# Patient Record
Sex: Male | Born: 1965 | Race: Black or African American | Hispanic: No | Marital: Single | Smoking: Never smoker
Health system: Southern US, Community
[De-identification: ages and names within clinical notes are randomized; demographics above are authoritative.]

## PROBLEM LIST (undated history)

## (undated) DIAGNOSIS — M545 Low back pain, unspecified: Secondary | ICD-10-CM

## (undated) DIAGNOSIS — K219 Gastro-esophageal reflux disease without esophagitis: Secondary | ICD-10-CM

## (undated) DIAGNOSIS — M1990 Unspecified osteoarthritis, unspecified site: Secondary | ICD-10-CM

## (undated) DIAGNOSIS — G709 Myoneural disorder, unspecified: Secondary | ICD-10-CM

## (undated) DIAGNOSIS — M4800 Spinal stenosis, site unspecified: Secondary | ICD-10-CM

## (undated) DIAGNOSIS — T7840XA Allergy, unspecified, initial encounter: Secondary | ICD-10-CM

## (undated) HISTORY — DX: Low back pain, unspecified: M54.50

## (undated) HISTORY — PX: COLONOSCOPY: SHX174

## (undated) HISTORY — PX: NO PAST SURGERIES: SHX2092

## (undated) HISTORY — DX: Unspecified osteoarthritis, unspecified site: M19.90

## (undated) HISTORY — DX: Allergy, unspecified, initial encounter: T78.40XA

## (undated) HISTORY — DX: Gastro-esophageal reflux disease without esophagitis: K21.9

## (undated) HISTORY — DX: Myoneural disorder, unspecified: G70.9

---
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
## 1999-05-14 ENCOUNTER — Encounter: Admission: RE | Admit: 1999-05-14 | Discharge: 1999-05-14 | Payer: Self-pay | Admitting: *Deleted

## 1999-05-14 ENCOUNTER — Encounter: Payer: Self-pay | Admitting: Emergency Medicine

## 2000-03-25 ENCOUNTER — Encounter: Payer: Self-pay | Admitting: Emergency Medicine

## 2000-03-25 ENCOUNTER — Emergency Department (HOSPITAL_COMMUNITY): Admission: EM | Admit: 2000-03-25 | Discharge: 2000-03-25 | Payer: Self-pay | Admitting: Emergency Medicine

## 2004-02-05 ENCOUNTER — Ambulatory Visit (HOSPITAL_BASED_OUTPATIENT_CLINIC_OR_DEPARTMENT_OTHER): Admission: RE | Admit: 2004-02-05 | Discharge: 2004-02-05 | Payer: Self-pay | Admitting: Nephrology

## 2004-03-09 ENCOUNTER — Ambulatory Visit (HOSPITAL_COMMUNITY): Admission: RE | Admit: 2004-03-09 | Discharge: 2004-03-09 | Payer: Self-pay | Admitting: Nephrology

## 2004-03-11 ENCOUNTER — Ambulatory Visit (HOSPITAL_COMMUNITY): Admission: RE | Admit: 2004-03-11 | Discharge: 2004-03-11 | Payer: Self-pay | Admitting: Nephrology

## 2004-03-15 ENCOUNTER — Ambulatory Visit (HOSPITAL_COMMUNITY): Admission: RE | Admit: 2004-03-15 | Discharge: 2004-03-15 | Payer: Self-pay | Admitting: Nephrology

## 2004-03-23 ENCOUNTER — Ambulatory Visit (HOSPITAL_COMMUNITY): Admission: RE | Admit: 2004-03-23 | Discharge: 2004-03-23 | Payer: Self-pay | Admitting: Nephrology

## 2004-11-27 IMAGING — RF DG UGI W/ KUB
19 series · 19 of 19 positions shown · non-contrast
Comparison: none

CLINICAL DATA: Right-sided abdominal pain.  Vomiting. 
 DOUBLE CONTRAST UPPER GI SERIES WITH HIGH-DENSITY BARIUM AND KUB
 The scout radiograph shows a normal bowel gas pattern.
 The esophagus is normal in appearance.  There is no evidence of mass or stricture.  There is no evidence of hiatal hernia.  No gastroesophageal reflux was seen during the study.  Esophageal motility is within normal limits. 
 The stomach is normal in appearance.  There is no evidence of masses or ulcers.  The duodenal bulb and sweep are also normal in appearance. 
 IMPRESSION
 Normal upper GI series.

[Series 1: run · 1 of 1 slices shown (1 of 19)]
[im 1/1]
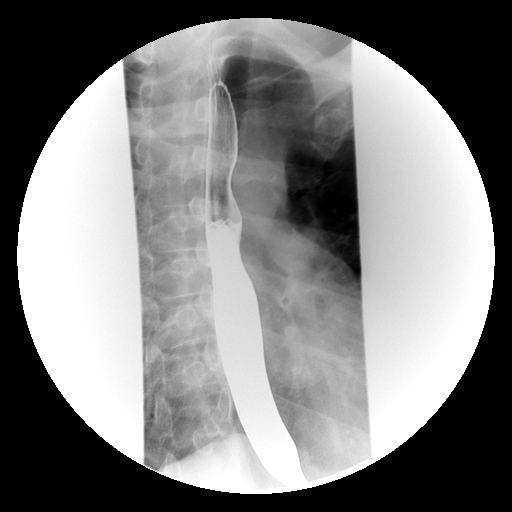

[Series 2: run · 1 of 1 slices shown (2 of 19)]
[im 1/1]
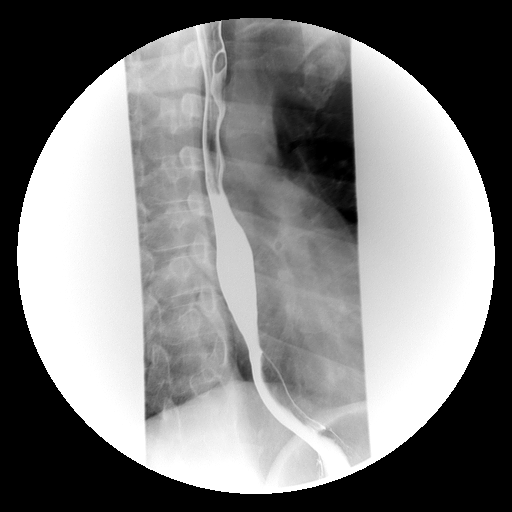

[Series 3: run · 1 of 1 slices shown (3 of 19)]
[im 1/1]
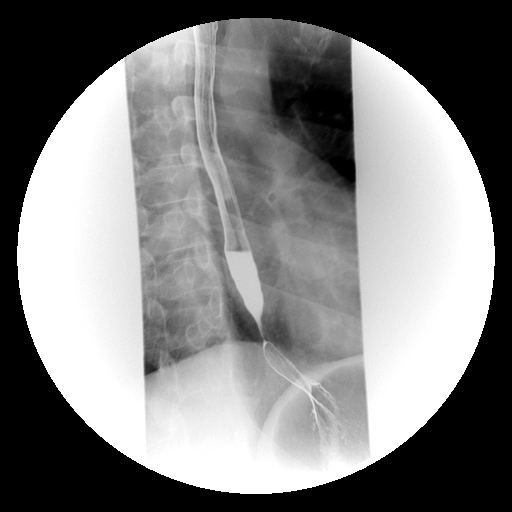

[Series 4: run · 1 of 1 slices shown (4 of 19)]
[im 1/1]
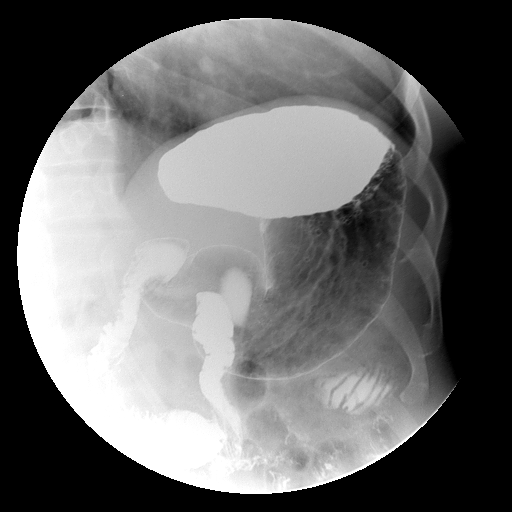

[Series 5: run · 1 of 1 slices shown (5 of 19)]
[im 1/1]
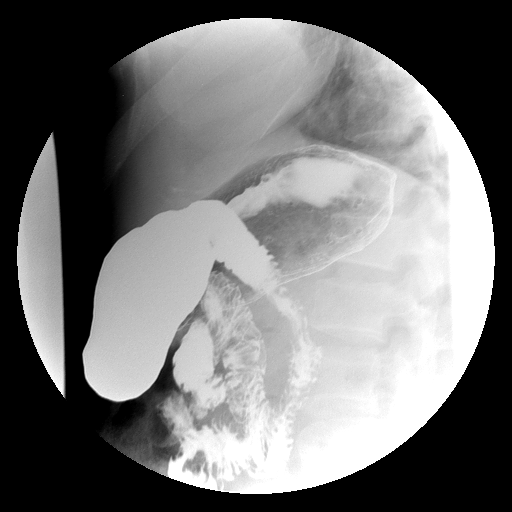

[Series 6: run · 1 of 1 slices shown (6 of 19)]
[im 1/1]
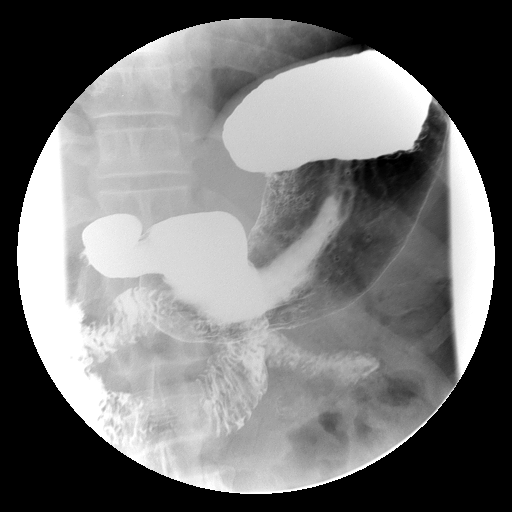

[Series 7: run · 1 of 1 slices shown (7 of 19)]
[im 1/1]
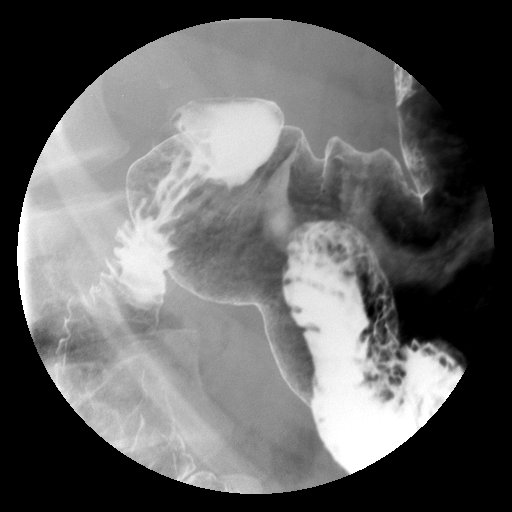

[Series 8: run · 1 of 1 slices shown (8 of 19)]
[im 1/1]
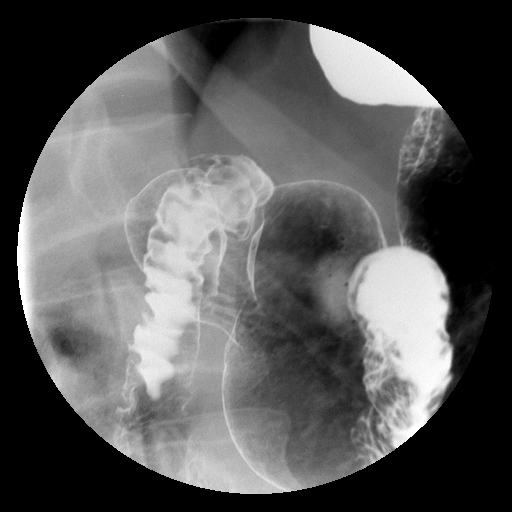

[Series 9: run · 1 of 1 slices shown (9 of 19)]
[im 1/1]
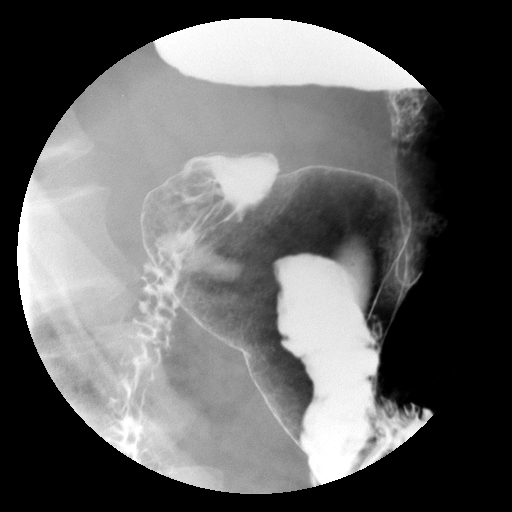

[Series 10: run · 1 of 1 slices shown (10 of 19)]
[im 1/1]
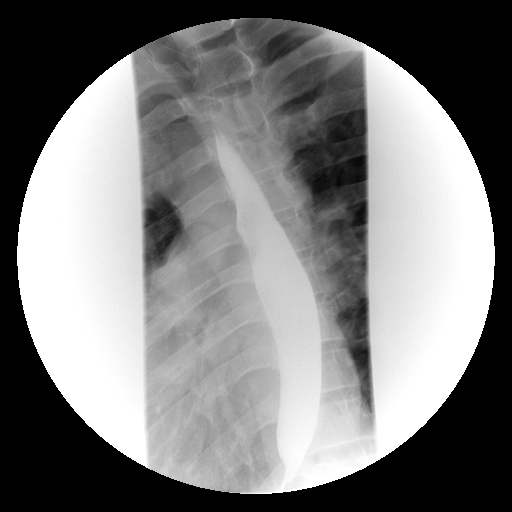

[Series 11: run · 1 of 1 slices shown (11 of 19)]
[im 1/1]
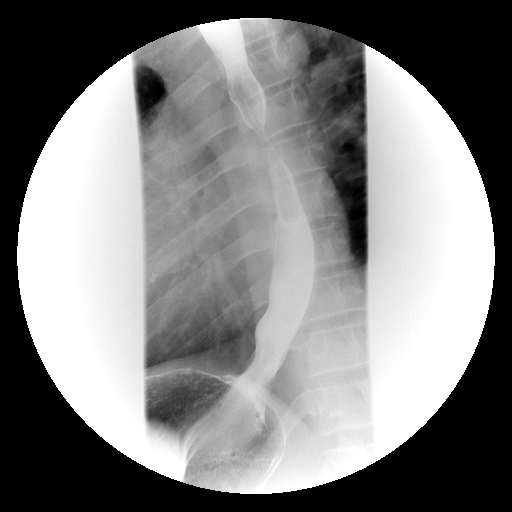

[Series 12: run · 1 of 1 slices shown (12 of 19)]
[im 1/1]
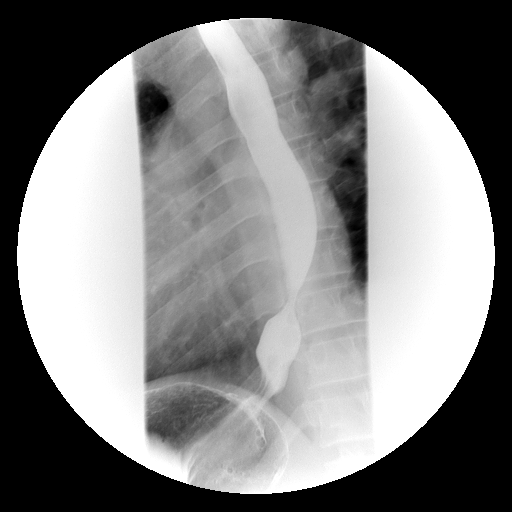

[Series 13: run · 1 of 1 slices shown (13 of 19)]
[im 1/1]
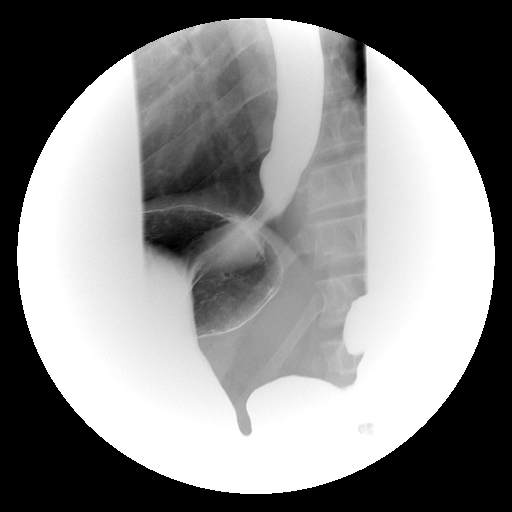

[Series 14: run · 1 of 1 slices shown (14 of 19)]
[im 1/1]
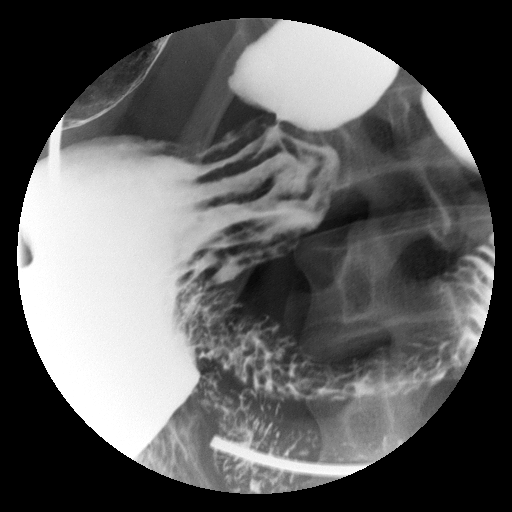

[Series 15: run · 1 of 1 slices shown (15 of 19)]
[im 1/1]
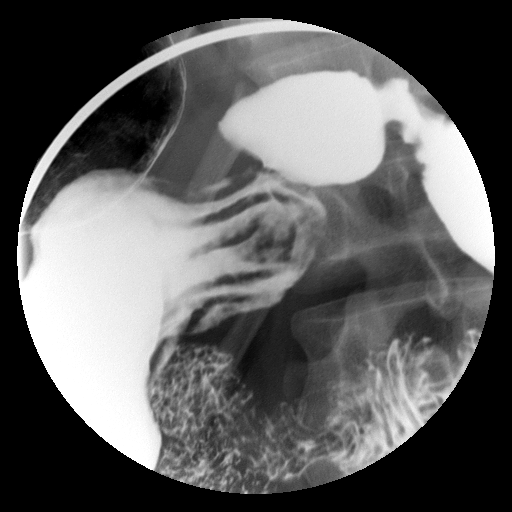

[Series 16: run · 1 of 1 slices shown (16 of 19)]
[im 1/1]
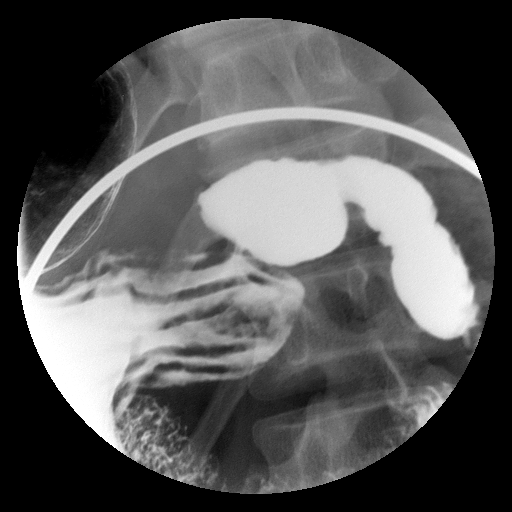

[Series 17: run · 1 of 1 slices shown (17 of 19)]
[im 1/1]
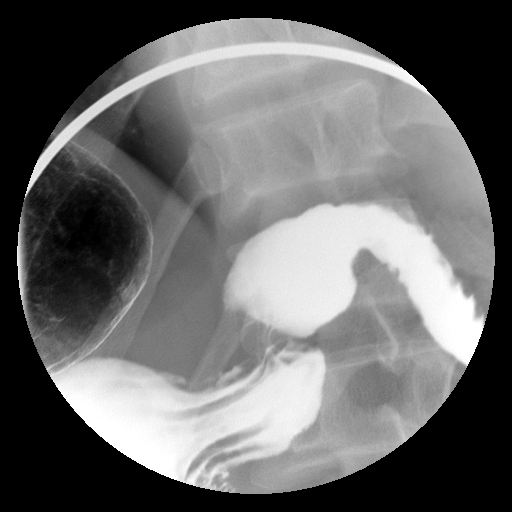

[Series 18: run · 1 of 1 slices shown (18 of 19)]
[im 1/1]
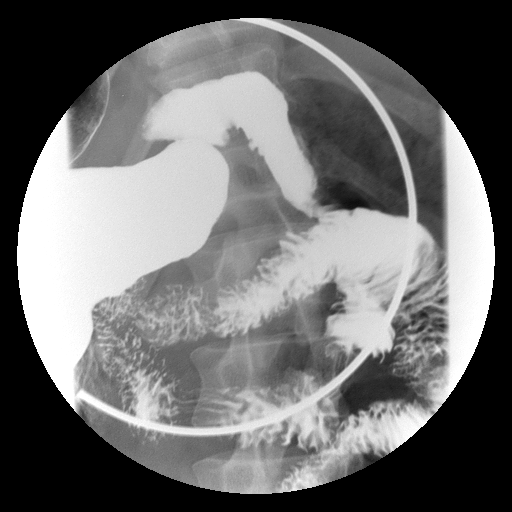

[Series 19: run · 1 of 1 slices shown (19 of 19)]
[im 1/1]
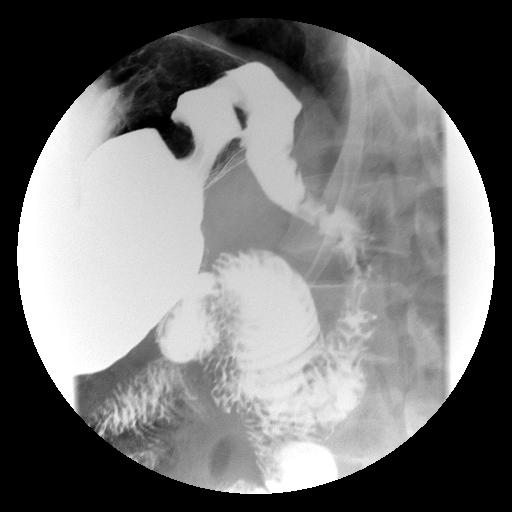

[19 of 19 positions shown; findings below may reference images not displayed]

## 2005-09-20 ENCOUNTER — Encounter: Admission: RE | Admit: 2005-09-20 | Discharge: 2005-09-20 | Payer: Self-pay | Admitting: Nephrology

## 2012-06-19 ENCOUNTER — Ambulatory Visit
Admission: RE | Admit: 2012-06-19 | Discharge: 2012-06-19 | Disposition: A | Discharge status: Home / Self Care | Payer: Self-pay | Source: Ambulatory Visit | Attending: Nephrology | Admitting: Nephrology

## 2012-06-19 ENCOUNTER — Other Ambulatory Visit: Payer: Self-pay | Admitting: Nephrology

## 2012-06-19 DIAGNOSIS — M549 Dorsalgia, unspecified: Secondary | ICD-10-CM

## 2013-03-26 ENCOUNTER — Ambulatory Visit: Payer: BC Managed Care – PPO | Attending: Internal Medicine | Admitting: Physical Therapy

## 2013-03-26 DIAGNOSIS — M25569 Pain in unspecified knee: Secondary | ICD-10-CM | POA: Insufficient documentation

## 2013-03-26 DIAGNOSIS — M25559 Pain in unspecified hip: Secondary | ICD-10-CM | POA: Insufficient documentation

## 2013-03-26 DIAGNOSIS — IMO0001 Reserved for inherently not codable concepts without codable children: Secondary | ICD-10-CM | POA: Insufficient documentation

## 2013-03-26 DIAGNOSIS — M545 Low back pain, unspecified: Secondary | ICD-10-CM | POA: Insufficient documentation

## 2013-03-26 DIAGNOSIS — R293 Abnormal posture: Secondary | ICD-10-CM | POA: Insufficient documentation

## 2013-04-02 ENCOUNTER — Ambulatory Visit: Payer: BC Managed Care – PPO | Admitting: Physical Therapy

## 2013-04-04 ENCOUNTER — Ambulatory Visit: Payer: BC Managed Care – PPO | Admitting: Physical Therapy

## 2013-04-09 ENCOUNTER — Ambulatory Visit: Payer: BC Managed Care – PPO | Admitting: Physical Therapy

## 2013-04-11 ENCOUNTER — Ambulatory Visit: Payer: BC Managed Care – PPO | Admitting: Physical Therapy

## 2013-04-17 ENCOUNTER — Ambulatory Visit: Payer: BC Managed Care – PPO | Admitting: Physical Therapy

## 2013-04-18 ENCOUNTER — Ambulatory Visit: Payer: BC Managed Care – PPO | Admitting: Physical Therapy

## 2013-04-22 ENCOUNTER — Ambulatory Visit: Payer: BC Managed Care – PPO | Admitting: Physical Therapy

## 2013-04-24 ENCOUNTER — Ambulatory Visit: Payer: BC Managed Care – PPO | Admitting: Physical Therapy

## 2019-02-20 ENCOUNTER — Encounter: Payer: Self-pay | Admitting: Family Medicine

## 2019-02-21 ENCOUNTER — Ambulatory Visit (INDEPENDENT_AMBULATORY_CARE_PROVIDER_SITE_OTHER): Payer: BC Managed Care – PPO | Admitting: Family Medicine

## 2019-02-21 ENCOUNTER — Encounter: Payer: Self-pay | Admitting: Family Medicine

## 2019-02-21 VITALS — BP 124/80 | HR 70 | Ht 73.0 in | Wt 220.0 lb

## 2019-02-21 DIAGNOSIS — Z125 Encounter for screening for malignant neoplasm of prostate: Secondary | ICD-10-CM

## 2019-02-21 DIAGNOSIS — Z13220 Encounter for screening for lipoid disorders: Secondary | ICD-10-CM

## 2019-02-21 DIAGNOSIS — Z1211 Encounter for screening for malignant neoplasm of colon: Secondary | ICD-10-CM | POA: Diagnosis not present

## 2019-02-21 DIAGNOSIS — Z0000 Encounter for general adult medical examination without abnormal findings: Secondary | ICD-10-CM

## 2019-02-21 LAB — POCT URINALYSIS DIPSTICK
BILIRUBIN UA: NEGATIVE
Blood, UA: NEGATIVE
GLUCOSE UA: NEGATIVE
Leukocytes, UA: NEGATIVE
Nitrite, UA: NEGATIVE
Protein, UA: NEGATIVE
SPEC GRAV UA: 1.025 (ref 1.010–1.025)
Urobilinogen, UA: 0.2 E.U./dL
pH, UA: 6 (ref 5.0–8.0)

## 2019-02-21 LAB — LIPID PANEL
CHOL/HDL RATIO: 2
Cholesterol: 161 mg/dL (ref 0–200)
HDL: 67.8 mg/dL (ref >39.00)
LDL CALC: 72 mg/dL (ref 0–99)
NONHDL: 93.58
Triglycerides: 109 mg/dL (ref 0.0–149.0)
VLDL: 21.8 mg/dL (ref 0.0–40.0)

## 2019-02-21 LAB — CBC
HEMATOCRIT: 43.8 % (ref 39.0–52.0)
HEMOGLOBIN: 15.3 g/dL (ref 13.0–17.0)
MCHC: 35 g/dL (ref 30.0–36.0)
MCV: 93.7 fl (ref 78.0–100.0)
PLATELETS: 239 10*3/uL (ref 150.0–400.0)
RBC: 4.67 Mil/uL (ref 4.22–5.81)
RDW: 12.6 % (ref 11.5–15.5)
WBC: 5 10*3/uL (ref 4.0–10.5)

## 2019-02-21 LAB — COMPREHENSIVE METABOLIC PANEL
ALBUMIN: 4.8 g/dL (ref 3.5–5.2)
ALT: 14 U/L (ref 0–53)
AST: 21 U/L (ref 0–37)
Alkaline Phosphatase: 49 U/L (ref 39–117)
BUN: 18 mg/dL (ref 6–23)
CALCIUM: 9.9 mg/dL (ref 8.4–10.5)
CHLORIDE: 101 meq/L (ref 96–112)
CO2: 30 mEq/L (ref 19–32)
Creatinine, Ser: 1.21 mg/dL (ref 0.40–1.50)
GFR: 63.27 mL/min (ref >60.00)
Glucose, Bld: 80 mg/dL (ref 70–99)
POTASSIUM: 4.3 meq/L (ref 3.5–5.1)
Sodium: 139 mEq/L (ref 135–145)
Total Bilirubin: 0.6 mg/dL (ref 0.2–1.2)
Total Protein: 7.5 g/dL (ref 6.0–8.3)

## 2019-02-21 LAB — PSA: PSA: 0.28 ng/mL (ref 0.10–4.00)

## 2019-02-21 LAB — TSH: TSH: 2.18 u[IU]/mL (ref 0.35–4.50)

## 2019-02-21 NOTE — Progress Notes (Signed)
DIETRICH SAMUELSON - 53 y.o. male MRN [id number]  Date of birth: Jan 23, 1966  Subjective Chief Complaint  Patient presents with  . Annual Exam    HPI CEASER EBELING is a 53 y.o. male here today for annual exam.  He has no concerns today and has been in fairly good health.  He has never had colon cancer screening and would like to have updated lab testing today.  He feels that he follows a fairly healthy diet and is working on incorporating more vegetables.  He walks for exercise regularly.  Review of Systems  Constitutional: Negative for chills, fever, malaise/fatigue and weight loss.  HENT: Negative for congestion, ear pain and sore throat.   Eyes: Negative for blurred vision, double vision and pain.  Respiratory: Negative for cough and shortness of breath.   Cardiovascular: Negative for chest pain and palpitations.  Gastrointestinal: Negative for abdominal pain, blood in stool, constipation, heartburn and nausea.  Genitourinary: Negative for dysuria and urgency.  Musculoskeletal: Positive for back pain. Negative for joint pain and myalgias.  Neurological: Negative for dizziness and headaches.  Endo/Heme/Allergies: Does not bruise/bleed easily.  Psychiatric/Behavioral: Negative for depression. The patient is not nervous/anxious and does not have insomnia.     No Known Allergies  History reviewed. No pertinent past medical history.  History reviewed. No pertinent surgical history.  Social History   Socioeconomic History  . Marital status: Single    Spouse name: Not on file  . Number of children: Not on file  . Years of education: Not on file  . Highest education level: Not on file  Occupational History  . Not on file  Social Needs  . Financial resource strain: Not on file  . Food insecurity:    Worry: Not on file    Inability: Not on file  . Transportation needs:    Medical: Not on file    Non-medical: Not on file  Tobacco Use  . Smoking status: Never Smoker  . Smokeless  tobacco: Never Used  Substance and Sexual Activity  . Alcohol use: Not on file  . Drug use: Not on file  . Sexual activity: Not on file  Lifestyle  . Physical activity:    Days per week: Not on file    Minutes per session: Not on file  . Stress: Not on file  Relationships  . Social connections:    Talks on phone: Not on file    Gets together: Not on file    Attends religious service: Not on file    Active member of club or organization: Not on file    Attends meetings of clubs or organizations: Not on file    Relationship status: Not on file  Other Topics Concern  . Not on file  Social History Narrative  . Not on file    History reviewed. No pertinent family history.  Health Maintenance  Topic Date Due  . HIV Screening  05/02/1983  . TETANUS/TDAP  05/02/1987  . COLONOSCOPY  05/01/2018  . INFLUENZA VACCINE  07/26/2018    ----------------------------------------------------------------------------------------------------------------------------------------------------------------------------------------------------------------- Physical Exam BP 124/80   Pulse 70   Ht 6' 1"  (1.854 m)   Wt 220 lb (99.8 kg)   SpO2 97%   BMI 29.03 kg/m   Physical Exam Constitutional:      General: He is not in acute distress.    Appearance: Normal appearance.  HENT:     Head: Normocephalic and atraumatic.     Right Ear: Tympanic membrane and external  ear normal.     Left Ear: Tympanic membrane and external ear normal.     Mouth/Throat:     Mouth: Mucous membranes are moist.  Eyes:     General: No scleral icterus. Neck:     Musculoskeletal: Normal range of motion.     Thyroid: No thyromegaly.  Cardiovascular:     Rate and Rhythm: Normal rate and regular rhythm.     Heart sounds: Normal heart sounds.  Pulmonary:     Effort: Pulmonary effort is normal.     Breath sounds: Normal breath sounds.  Abdominal:     General: Bowel sounds are normal. There is no distension.      Palpations: Abdomen is soft.     Tenderness: There is no abdominal tenderness. There is no guarding.  Lymphadenopathy:     Cervical: No cervical adenopathy.  Skin:    General: Skin is warm and dry.     Findings: No rash.  Neurological:     General: No focal deficit present.     Mental Status: He is alert and oriented to person, place, and time.     Cranial Nerves: No cranial nerve deficit.     Motor: No abnormal muscle tone.  Psychiatric:        Mood and Affect: Mood normal.        Behavior: Behavior normal.     ------------------------------------------------------------------------------------------------------------------------------------------------------------------------------------------------------------------- Assessment and Plan  Well adult exam Well adult Orders Placed This Encounter  Procedures  . Comp Met (CMET)  . CBC  . Lipid Profile  . TSH  . PSA  . Ambulatory referral to Gastroenterology    Referral Priority:   Routine    Referral Type:   Consultation    Referral Reason:   Specialty Services Required    Number of Visits Requested:   1  . POCT Urinalysis Dipstick   Screenings:  Colon cancer, lipid, psa Immunizations:  Declines flu vaccine Anticipatory guidance/Risk factor reduction:  Recommendations per AVS

## 2019-02-21 NOTE — Patient Instructions (Signed)

## 2019-02-21 NOTE — Assessment & Plan Note (Signed)
Well adult Orders Placed This Encounter  Procedures  . Comp Met (CMET)  . CBC  . Lipid Profile  . TSH  . PSA  . Ambulatory referral to Gastroenterology    Referral Priority:   Routine    Referral Type:   Consultation    Referral Reason:   Specialty Services Required    Number of Visits Requested:   1  . POCT Urinalysis Dipstick   Screenings:  Colon cancer, lipid, psa Immunizations:  Declines flu vaccine Anticipatory guidance/Risk factor reduction:  Recommendations per AVS

## 2019-02-22 ENCOUNTER — Encounter: Payer: Self-pay | Admitting: Family Medicine

## 2019-05-10 ENCOUNTER — Telehealth: Payer: Self-pay | Admitting: Family Medicine

## 2019-05-10 NOTE — Telephone Encounter (Signed)
Patient called and said his back has been bothering him and he wanted to set up an appointment, I tried to set him up with a virtual visit but he said he would just wait until he can be seen in office again whether with Dr Zigmund Daniel or whoever else. I told him I would verify with Dr Zigmund Daniel on when he could come in again

## 2019-05-13 NOTE — Telephone Encounter (Signed)
Wed or next monday

## 2019-05-13 NOTE — Telephone Encounter (Signed)
Called Pt . Scheduled for Wed @ 10:30 am . Pre-screened for COVID 19

## 2019-05-14 ENCOUNTER — Encounter: Payer: Self-pay | Admitting: Family Medicine

## 2019-05-14 ENCOUNTER — Telehealth (INDEPENDENT_AMBULATORY_CARE_PROVIDER_SITE_OTHER): Payer: BC Managed Care – PPO | Admitting: Family Medicine

## 2019-05-14 VITALS — Ht 73.0 in | Wt 220.0 lb

## 2019-05-14 DIAGNOSIS — M545 Low back pain, unspecified: Secondary | ICD-10-CM | POA: Insufficient documentation

## 2019-05-14 DIAGNOSIS — G8929 Other chronic pain: Secondary | ICD-10-CM | POA: Diagnosis not present

## 2019-05-14 MED ORDER — NAPROXEN 500 MG PO TABS: 500.0000 mg | ORAL_TABLET | Freq: Two times a day (BID) | ORAL | 0 refills | Status: DC

## 2019-05-14 NOTE — Assessment & Plan Note (Signed)
-  Will send HEP through mychart -Start naprosyn 500mg  BID -Discussed referral to PT however he would prefer opinion of sports medicine prior to doing this.  -Referral to sports medicine per patient request.

## 2019-05-14 NOTE — Patient Instructions (Signed)
Low Back Sprain Rehab  Ask your health care provider which exercises are safe for you. Do exercises exactly as told by your health care provider and adjust them as directed. It is normal to feel mild stretching, pulling, tightness, or discomfort as you do these exercises, but you should stop right away if you feel sudden pain or your pain gets worse. Do not begin these exercises until told by your health care provider.  Stretching and range of motion exercises  These exercises warm up your muscles and joints and improve the movement and flexibility of your back. These exercises also help to relieve pain, numbness, and tingling.  Exercise A: Lumbar rotation    1. Lie on your back on a firm surface and bend your knees.  2. Straighten your arms out to your sides so each arm forms an "L" shape with a side of your body (a 90 degree angle).  3. Slowly move both of your knees to one side of your body until you feel a stretch in your lower back. Try not to let your shoulders move off of the floor.  4. Hold for __________ seconds.  5. Tense your abdominal muscles and slowly move your knees back to the starting position.  6. Repeat this exercise on the other side of your body.  Repeat __________ times. Complete this exercise __________ times a day.  Exercise B: Prone extension on elbows    1. Lie on your abdomen on a firm surface.  2. Prop yourself up on your elbows.  3. Use your arms to help lift your chest up until you feel a gentle stretch in your abdomen and your lower back.  ? This will place some of your body weight on your elbows. If this is uncomfortable, try stacking pillows under your chest.  ? Your hips should stay down, against the surface that you are lying on. Keep your hip and back muscles relaxed.  4. Hold for __________ seconds.  5. Slowly relax your upper body and return to the starting position.  Repeat __________ times. Complete this exercise __________ times a day.  Strengthening exercises  These  exercises build strength and endurance in your back. Endurance is the ability to use your muscles for a long time, even after they get tired.  Exercise C: Pelvic tilt  1. Lie on your back on a firm surface. Bend your knees and keep your feet flat.  2. Tense your abdominal muscles. Tip your pelvis up toward the ceiling and flatten your lower back into the floor.  ? To help with this exercise, you may place a small towel under your lower back and try to push your back into the towel.  3. Hold for __________ seconds.  4. Let your muscles relax completely before you repeat this exercise.  Repeat __________ times. Complete this exercise __________ times a day.  Exercise D: Alternating arm and leg raises    1. Get on your hands and knees on a firm surface. If you are on a hard floor, you may want to use padding to cushion your knees, such as an exercise mat.  2. Line up your arms and legs. Your hands should be below your shoulders, and your knees should be below your hips.  3. Lift your left leg behind you. At the same time, raise your right arm and straighten it in front of you.  ? Do not lift your leg higher than your hip.  ? Do not lift your arm   higher than your shoulder.  ? Keep your abdominal and back muscles tight.  ? Keep your hips facing the ground.  ? Do not arch your back.  ? Keep your balance carefully, and do not hold your breath.  4. Hold for __________ seconds.  5. Slowly return to the starting position and repeat with your right leg and your left arm.  Repeat __________ times. Complete this exercise __________ times a day.  Exercise E: Abdominal set with straight leg raise    1. Lie on your back on a firm surface.  2. Bend one of your knees and keep your other leg straight.  3. Tense your abdominal muscles and lift your straight leg up, 4-6 inches (10-15 cm) off the ground.  4. Keep your abdominal muscles tight and hold for __________ seconds.  ? Do not hold your breath.  ? Do not arch your back. Keep it  flat against the ground.  5. Keep your abdominal muscles tense as you slowly lower your leg back to the starting position.  6. Repeat with your other leg.  Repeat __________ times. Complete this exercise __________ times a day.  Posture and body mechanics    Body mechanics refers to the movements and positions of your body while you do your daily activities. Posture is part of body mechanics. Good posture and healthy body mechanics can help to relieve stress in your body's tissues and joints. Good posture means that your spine is in its natural S-curve position (your spine is neutral), your shoulders are pulled back slightly, and your head is not tipped forward. The following are general guidelines for applying improved posture and body mechanics to your everyday activities.  Standing    · When standing, keep your spine neutral and your feet about hip-width apart. Keep a slight bend in your knees. Your ears, shoulders, and hips should line up.  · When you do a task in which you stand in one place for a long time, place one foot up on a stable object that is 2-4 inches (5-10 cm) high, such as a footstool. This helps keep your spine neutral.  Sitting    · When sitting, keep your spine neutral and keep your feet flat on the floor. Use a footrest, if necessary, and keep your thighs parallel to the floor. Avoid rounding your shoulders, and avoid tilting your head forward.  · When working at a desk or a computer, keep your desk at a height where your hands are slightly lower than your elbows. Slide your chair under your desk so you are close enough to maintain good posture.  · When working at a computer, place your monitor at a height where you are looking straight ahead and you do not have to tilt your head forward or downward to look at the screen.  Resting    · When lying down and resting, avoid positions that are most painful for you.  · If you have pain with activities such as sitting, bending, stooping, or squatting  (flexion-based activities), lie in a position in which your body does not bend very much. For example, avoid curling up on your side with your arms and knees near your chest (fetal position).  · If you have pain with activities such as standing for a long time or reaching with your arms (extension-based activities), lie with your spine in a neutral position and bend your knees slightly. Try the following positions:  · Lying on your side with a   pillow between your knees.  · Lying on your back with a pillow under your knees.  Lifting    · When lifting objects, keep your feet at least shoulder-width apart and tighten your abdominal muscles.  · Bend your knees and hips and keep your spine neutral. It is important to lift using the strength of your legs, not your back. Do not lock your knees straight out.  · Always ask for help to lift heavy or awkward objects.  This information is not intended to replace advice given to you by your health care provider. Make sure you discuss any questions you have with your health care provider.  Document Released: 12/12/2005 Document Revised: 08/18/2016 Document Reviewed: 09/23/2015  Elsevier Interactive Patient Education © 2019 [employer]

## 2019-05-14 NOTE — Progress Notes (Signed)
Philip Henson - 53 y.o. male MRN [id number]  Date of birth: 12-Mar-1966   This visit type was conducted due to national recommendations for restrictions regarding the COVID-19 Pandemic (e.g. social distancing).  This format is felt to be most appropriate for this patient at this time.  All issues noted in this document were discussed and addressed.  No physical exam was performed (except for noted visual exam findings with Video Visits).  I discussed the limitations of evaluation and management by telemedicine and the availability of in person appointments. The patient expressed understanding and agreed to proceed.  I connected with@ on 05/14/19 at  8:30 AM EDT by a video enabled telemedicine application and verified that I am speaking with the correct person using two identifiers.   Patient Location: [place] [address]   Provider location:   Home office  Chief Complaint  Patient presents with  . Back Pain    eposidic, back/hip R>L side . Pain level 8 in am , then streches-5 , long timesedatary aggrvaters     HPI  Philip Henson is a 53 y.o. male who presents via audio/video conferencing for a telehealth visit today.  He has complaint of back pain today.  This is an acute on chronic problem for him.  Recent flare up started a couple of weeks ago. He denies any known injury or overuse.  Pain is bilateral but worse on the R.  Tends to be worse after sitting for long period.  Radiates down back of legs sometimes.  Denies weakness or numbness.  Has had this evaluated before and did HEP with etodolac and tizanidine. Most recently used aleve with some relief but effects are not as strong now. Imaging has been normal in the past.  Denies any other joint pain or swelling.     ROS:  A comprehensive ROS was completed and negative except as noted per HPI  Past Medical History:  Diagnosis Date  . GERD (gastroesophageal reflux disease)     No past surgical history on file.   No family history on file.  Social History   Socioeconomic History  . Marital status: Single    Spouse name: Not on file  . Number of children: Not on file  . Years of education: Not on file  . Highest education level: Not on file  Occupational History  . Not on file  Social Needs  . Financial resource strain: Not on file  . Food insecurity:    Worry: Not on file    Inability: Not on file  . Transportation needs:    Medical: Not on file    Non-medical: Not on file  Tobacco Use  . Smoking status: Never Smoker  . Smokeless tobacco: Never Used  Substance and Sexual Activity  . Alcohol use: Yes  . Drug use: Never  . Sexual activity: Not Currently  Lifestyle  . Physical activity:    Days per week: Not on file    Minutes per session: Not on file  . Stress: Not on file  Relationships  . Social connections:    Talks on phone: Not on file    Gets together: Not on file    Attends religious service: Not on file    Active member of club or organization: Not on file    Attends meetings of clubs or organizations: Not on file    Relationship status: Not on file  . Intimate partner violence:  Fear of current or ex partner: Not on file    Emotionally abused: Not on file    Physically abused: Not on file    Forced sexual activity: Not on file  Other Topics Concern  . Not on file  Social History Narrative  . Not on file     Current Outpatient Medications:  .  naproxen (NAPROSYN) 500 MG tablet, Take 1 tablet (500 mg total) by mouth 2 (two) times daily with a meal., Disp: 30 tablet, Rfl: 0  EXAM:  VITALS per patient if applicable: Ht 6\' 1"  (1.854 m)   Wt 220 lb (99.8 kg) Comment: pt reports from h[facility]  BMI 29.03 kg/m   GENERAL: alert, oriented, appears well and in no acute distress  HEENT: atraumatic, conjunttiva clear, no obvious abnormalities on inspection of external nose and ears  NECK: normal movements of the head and neck  LUNGS: on inspection no signs of  respiratory distress, breathing rate appears normal, no obvious gross SOB, gasping or wheezing  CV: no obvious cyanosis  MS: moves all visible extremities without noticeable abnormality  PSYCH/NEURO: pleasant and cooperative, no obvious depression or anxiety, speech and thought processing grossly intact  ASSESSMENT AND PLAN:  Discussed the following assessment and plan:  Chronic bilateral low back pain -Will send HEP through mychart -Start naprosyn 500mg  BID -Discussed referral to PT however he would prefer opinion of sports medicine prior to doing this.  -Referral to sports medicine per patient request.        I discussed the assessment and treatment plan with the patient. The patient was provided an opportunity to ask questions and all were answered. The patient agreed with the plan and demonstrated an understanding of the instructions.   The patient was advised to call back or seek an in-person evaluation if the symptoms worsen or if the condition fails to improve as anticipated.   Luetta Nutting, DO

## 2019-05-15 ENCOUNTER — Telehealth: Payer: BC Managed Care – PPO | Admitting: Family Medicine

## 2019-05-15 ENCOUNTER — Ambulatory Visit: Payer: BC Managed Care – PPO | Admitting: Family Medicine

## 2019-05-24 ENCOUNTER — Telehealth: Payer: Self-pay

## 2019-05-24 NOTE — Telephone Encounter (Signed)
Questions for Screening COVID-19  Symptom onset: n/a  Travel or Contacts: no During this illness, did/does the patient experience any of the following symptoms? Fever >100.43F []   Yes [x]   No []   Unknown Subjective fever (felt feverish) []   Yes [x]   No []   Unknown Chills []   Yes [x]   No []   Unknown Muscle aches (myalgia) []   Yes [x]   No []   Unknown Runny nose (rhinorrhea) []   Yes [x]   No []   Unknown Sore throat []   Yes [x]   No []   Unknown Cough (new onset or worsening of chronic cough) []   Yes [x]   No []   Unknown Shortness of breath (dyspnea) []   Yes [x]   No []   Unknown Nausea or vomiting []   Yes [x]   No []   Unknown Headache []   Yes [x]   No []   Unknown Abdominal pain  []   Yes [x]   No []   Unknown Diarrhea (?3 loose/looser than normal stools/24hr period) []   Yes [x]   No []   Unknown Other, specify:  Patient risk factors: Smoker? []   Current []   Former []   Never If male, currently pregnant? []   Yes []   No  Patient Active Problem List   Diagnosis Date Noted  . Chronic bilateral low back pain 05/14/2019  . Well adult exam 02/21/2019    Plan:  []   High risk for COVID-19 with red flags go to ED (with CP, SOB, weak/lightheaded, or fever > 101.5). Call ahead.  []   High risk for COVID-19 but stable. Inform provider and coordinate time for [facility] visit.   []   No red flags but URI signs or symptoms okay for [facility] visit.

## 2019-05-27 ENCOUNTER — Encounter: Payer: Self-pay | Admitting: Family Medicine

## 2019-05-27 ENCOUNTER — Ambulatory Visit: Payer: BC Managed Care – PPO | Admitting: Family Medicine

## 2019-05-27 ENCOUNTER — Ambulatory Visit (INDEPENDENT_AMBULATORY_CARE_PROVIDER_SITE_OTHER): Payer: BC Managed Care – PPO

## 2019-05-27 ENCOUNTER — Other Ambulatory Visit: Payer: Self-pay

## 2019-05-27 VITALS — BP 120/70 | HR 67 | Temp 98.4°F

## 2019-05-27 DIAGNOSIS — M5441 Lumbago with sciatica, right side: Secondary | ICD-10-CM

## 2019-05-27 DIAGNOSIS — G8929 Other chronic pain: Secondary | ICD-10-CM

## 2019-05-27 DIAGNOSIS — M5442 Lumbago with sciatica, left side: Secondary | ICD-10-CM

## 2019-05-27 NOTE — Progress Notes (Signed)
Philip Henson - 53 y.o. male MRN [id number]  Date of birth: 04-04-1966  SUBJECTIVE:  Including CC & ROS.  Chief Complaint  Patient presents with  . Pain    lower back pain/ pt feels like something in groin has been pinched/ right leg feels numb/ 10 years    [name] is a 53 y.o. male that is presenting with acute on chronic worsening low back pain.  This is occurring in the bilateral lower spine.  He does feel some pain in the groin region.  He feels this intermittently.  He feels like his symptoms are worse if he sits for a prolonged period of time.  He has felt like the pain is ongoing for about 10 years.  Denies any inciting event or trauma.  He has tried physical therapy, anti-inflammatories and massages and chiropractors.  Has not had improvement with his symptoms.  They can range from mild to severe.  Has not had an MRI.  Has never had surgery..  Independent review of the lumbar x-ray from 2013 shows normal disc height no significant degenerative changes.   Review of Systems  Constitutional: Negative for fever.  HENT: Negative for congestion.   Respiratory: Negative for cough.   Cardiovascular: Negative for chest pain.  Gastrointestinal: Negative for abdominal pain.  Musculoskeletal: Positive for back pain.  Skin: Negative for color change.  Neurological: Negative for weakness.  Hematological: Negative for adenopathy.    HISTORY: Past Medical, Surgical, Social, and Family History Reviewed & Updated per EMR.   Pertinent Historical Findings include:  Past Medical History:  Diagnosis Date  . GERD (gastroesophageal reflux disease)     No past surgical history on file.  No Known Allergies  No family history on file.   Social History   Socioeconomic History  . Marital status: Single    Spouse name: Not on file  . Number of children: Not on file  . Years of education: Not on file  . Highest education level: Not on file  Occupational History  . Not on file  Social  Needs  . Financial resource strain: Not on file  . Food insecurity:    Worry: Not on file    Inability: Not on file  . Transportation needs:    Medical: Not on file    Non-medical: Not on file  Tobacco Use  . Smoking status: Never Smoker  . Smokeless tobacco: Never Used  Substance and Sexual Activity  . Alcohol use: Yes  . Drug use: Never  . Sexual activity: Not Currently  Lifestyle  . Physical activity:    Days per week: Not on file    Minutes per session: Not on file  . Stress: Not on file  Relationships  . Social connections:    Talks on phone: Not on file    Gets together: Not on file    Attends religious service: Not on file    Active member of club or organization: Not on file    Attends meetings of clubs or organizations: Not on file    Relationship status: Not on file  . Intimate partner violence:    Fear of current or ex partner: Not on file    Emotionally abused: Not on file    Physically abused: Not on file    Forced sexual activity: Not on file  Other Topics Concern  . Not on file  Social History Narrative  . Not on file     PHYSICAL EXAM:  VS:  BP 120/70   Pulse 67   Temp 98.4 F (36.9 C) (Oral)   SpO2 98%  Physical Exam Gen: NAD, alert, cooperative with exam, well-appearing ENT: normal lips, normal nasal mucosa,  Eye: normal EOM, normal conjunctiva and lids CV:  no edema, +2 pedal pulses   Resp: no accessory muscle use, non-labored,  Skin: no rashes, no areas of induration  Neuro: normal tone, normal sensation to touch Psych:  normal insight, alert and oriented MSK:  Back Exam:  Inspection: Unremarkable  Palpable tenderness: minor over the left and right SI joint Range of Motion:  Flexion 45 deg; Extension 45 deg; Side Bending to 45 deg bilaterally; Rotation to 45 deg bilaterally  Leg strength: Quad: 5/5 Hamstring: 5/5 Hip flexor: 5/5 Hip abductors: 5/5  Strength at foot: Plantar-flexion: 5/5 Dorsi-flexion: 5/5 Eversion: 5/5 Inversion:  5/5 Gait unremarkable. SLR positive  Neurovascularly intact      ASSESSMENT & PLAN:   Chronic bilateral low back pain Acute on chronic and worsening. Possible for SI joint involvement. Groin pain could be hip related. Has been through PT, medications, massage and chiropractors.  - xray lumbar and hips  - MRI to evaluate for nerve impingement

## 2019-05-27 NOTE — Assessment & Plan Note (Signed)
Acute on chronic and worsening. Possible for SI joint involvement. Groin pain could be hip related. Has been through PT, medications, massage and chiropractors.  - xray lumbar and hips  - MRI to evaluate for nerve impingement

## 2019-05-27 NOTE — Patient Instructions (Signed)
Nice to meet you You can take the medicine provided by Dr. Zigmund Daniel  I will call you with the results from today   Please send me a message in [place] with any questions or updates.  I will call you once the MRI is completed.   --Dr. Raeford Razor

## 2019-05-28 ENCOUNTER — Telehealth: Payer: Self-pay | Admitting: Family Medicine

## 2019-05-28 NOTE — Telephone Encounter (Signed)
Spoke with patient about xray results.   Rosemarie Ax, MD [facility] 05/28/2019, 2:29 PM

## 2019-05-29 ENCOUNTER — Encounter: Payer: Self-pay | Admitting: Gastroenterology

## 2019-06-05 ENCOUNTER — Other Ambulatory Visit: Payer: Self-pay | Admitting: Family Medicine

## 2019-06-20 ENCOUNTER — Ambulatory Visit: Payer: BC Managed Care – PPO

## 2019-06-20 ENCOUNTER — Other Ambulatory Visit: Payer: Self-pay

## 2019-06-20 VITALS — Ht 73.0 in | Wt 222.0 lb

## 2019-06-20 DIAGNOSIS — Z1211 Encounter for screening for malignant neoplasm of colon: Secondary | ICD-10-CM

## 2019-06-20 MED ORDER — SUPREP BOWEL PREP KIT 17.5-3.13-1.6 GM/177ML PO SOLN: 1.0000 | Freq: Once | ORAL | 0 refills | Status: AC

## 2019-06-20 NOTE — Progress Notes (Signed)
Per pt, no allergies to soy or egg products.Pt not taking any weight loss meds or using  O2 at home. Pt denies sedation problems.  Emmi video info mailed to pt.  The PV was done over the phone due to COVID-19. Verified pt's address and insurance with him. I reviewed medical hx and prep instructions with the pt and will mail instructions to the pt today. Informed pt to call our office with any questions or changes prior to the procedure. He understood.

## 2019-06-26 ENCOUNTER — Telehealth: Payer: Self-pay | Admitting: Gastroenterology

## 2019-06-26 NOTE — Telephone Encounter (Signed)

## 2019-06-27 ENCOUNTER — Other Ambulatory Visit: Payer: Self-pay

## 2019-06-27 ENCOUNTER — Ambulatory Visit (AMBULATORY_SURGERY_CENTER): Payer: BC Managed Care – PPO | Admitting: Gastroenterology

## 2019-06-27 ENCOUNTER — Encounter: Payer: Self-pay | Admitting: Gastroenterology

## 2019-06-27 VITALS — BP 110/63 | HR 64 | Temp 98.4°F | Resp 20 | Ht 73.0 in | Wt 222.0 lb

## 2019-06-27 DIAGNOSIS — D123 Benign neoplasm of transverse colon: Secondary | ICD-10-CM

## 2019-06-27 DIAGNOSIS — Z1211 Encounter for screening for malignant neoplasm of colon: Secondary | ICD-10-CM

## 2019-06-27 DIAGNOSIS — K635 Polyp of colon: Secondary | ICD-10-CM

## 2019-06-27 MED ORDER — SODIUM CHLORIDE 0.9 % IV SOLN: 500.0000 mL | Freq: Once | INTRAVENOUS | Status: DC

## 2019-06-27 NOTE — Progress Notes (Signed)
Called to room to assist during endoscopic procedure.  Patient ID and intended procedure confirmed with present staff. Received instructions for my participation in the procedure from the performing physician.  

## 2019-06-27 NOTE — Patient Instructions (Addendum)
Information on polyps & hemorrhoids given to you today     Await pathology results on the colon  polyp removed today   YOU HAD AN ENDOSCOPIC PROCEDURE TODAY AT [place]:   Refer to the procedure report that was given to you for any specific questions about what was found during the examination.  If the procedure report does not answer your questions, please call your gastroenterologist to clarify.  If you requested that your care partner not be given the details of your procedure findings, then the procedure report has been included in a sealed envelope for you to review at your convenience later.  YOU SHOULD EXPECT: Some feelings of bloating in the abdomen. Passage of more gas than usual.  Walking can help get rid of the air that was put into your GI tract during the procedure and reduce the bloating. If you had a lower endoscopy (such as a colonoscopy or flexible sigmoidoscopy) you may notice spotting of blood in your stool or on the toilet paper. If you underwent a bowel prep for your procedure, you may not have a normal bowel movement for a few days.  Please Note:  You might notice some irritation and congestion in your nose or some drainage.  This is from the oxygen used during your procedure.  There is no need for concern and it should clear up in a day or so.  SYMPTOMS TO REPORT IMMEDIATELY:   Following lower endoscopy (colonoscopy or flexible sigmoidoscopy):  Excessive amounts of blood in the stool  Significant tenderness or worsening of abdominal pains  Swelling of the abdomen that is new, acute  Fever of 100F or higher    For urgent or emergent issues, a gastroenterologist can be reached at any hour by calling [phone].   DIET:  We do recommend a small meal at first, but then you may proceed to your regular diet.  Drink plenty of fluids but you should avoid alcoholic beverages for 24 hours.  ACTIVITY:  You should plan to take it easy for the rest  of today and you should NOT DRIVE or use heavy machinery until tomorrow (because of the sedation medicines used during the test).    FOLLOW UP: Our staff will call the number listed on your records 48-72 hours following your procedure to check on you and address any questions or concerns that you may have regarding the information given to you following your procedure. If we do not reach you, we will leave a message.  We will attempt to reach you two times.  During this call, we will ask if you have developed any symptoms of COVID 19. If you develop any symptoms (ie: fever, flu-like symptoms, shortness of breath, cough etc.) before then, please call [phone].  If you test positive for Covid 19 in the 2 weeks post procedure, please call and report this information to [place].    If any biopsies were taken you will be contacted by phone or by letter within the next 1-3 weeks.  Please call us at [phone] if you have not heard about the biopsies in 3 weeks.    SIGNATURES/CONFIDENTIALITY: You and/or your care partner have signed paperwork which will be entered into your electronic medical record.  These signatures attest to the fact that that the information above on your After Visit Summary has been reviewed and is understood.  Full responsibility of the confidentiality of this discharge information lies with you and/or your  care-partner.

## 2019-06-27 NOTE — Op Note (Signed)
[place] Patient Name: Philip Henson Procedure Date: 06/27/2019 11:36 AM MRN: [id number] Endoscopist: Remo Lipps P. Havery Moros , MD Age: 53 Referring MD:  Date of Birth: 1966-03-30 Gender: Male Account #: [id number] Procedure:                Colonoscopy Indications:              Screening for colorectal malignant neoplasm, This                            is the patient's first colonoscopy Medicines:                Monitored Anesthesia Care Procedure:                Pre-Anesthesia Assessment:                           - Prior to the procedure, a History and Physical                            was performed, and patient medications and                            allergies were reviewed. The patient's tolerance of                            previous anesthesia was also reviewed. The risks                            and benefits of the procedure and the sedation                            options and risks were discussed with the patient.                            All questions were answered, and informed consent                            was obtained. Prior Anticoagulants: The patient has                            taken no previous anticoagulant or antiplatelet                            agents. ASA Grade Assessment: II - A patient with                            mild systemic disease. After reviewing the risks                            and benefits, the patient was deemed in                            satisfactory condition to undergo the procedure.  After obtaining informed consent, the colonoscope                            was passed under direct vision. Throughout the                            procedure, the patient's blood pressure, pulse, and                            oxygen saturations were monitored continuously. The                            Model CF-HQ190L [id number]) scope was introduced                            through the anus and  advanced to the the cecum,                            identified by appendiceal orifice and ileocecal                            valve. The colonoscopy was performed without                            difficulty. The patient tolerated the procedure                            well. The quality of the bowel preparation was                            good. The ileocecal valve, appendiceal orifice, and                            rectum were photographed. Scope In: 11:40:14 AM Scope Out: 12:05:13 PM Scope Withdrawal Time: 0 hours 17 minutes 40 seconds  Total Procedure Duration: 0 hours 24 minutes 59 seconds  Findings:                 The perianal and digital rectal examinations were                            normal.                           A 3 mm polyp was found in the transverse colon. The                            polyp was sessile. The polyp was removed with a                            cold snare. Resection and retrieval were complete.                           The colon was long and redundant, leading to  looping which prolonged the exam.                           Internal hemorrhoids were found during retroflexion.                           The exam was otherwise without abnormality. Complications:            No immediate complications. Estimated blood loss:                            Minimal. Estimated Blood Loss:     Estimated blood loss was minimal. Impression:               - One 3 mm polyp in the transverse colon, removed                            with a cold snare. Resected and retrieved.                           - Redundant colon.                           - Internal hemorrhoids.                           - The examination was otherwise normal. Recommendation:           - Patient has a contact number available for                            emergencies. The signs and symptoms of potential                            delayed complications were  discussed with the                            patient. Return to normal activities tomorrow.                            Written discharge instructions were provided to the                            patient.                           - Resume previous diet.                           - Continue present medications.                           - Await pathology results. Remo Lipps P. Armbruster, MD 06/27/2019 12:08:55 PM This report has been signed electronically.

## 2019-06-27 NOTE — Progress Notes (Signed)
PT taken to PACU. Monitors in place. VSS. Report given to RN. 

## 2019-06-27 NOTE — Progress Notes (Signed)
Temp CH Vital signs JB

## 2019-07-02 ENCOUNTER — Telehealth: Payer: Self-pay

## 2019-07-02 ENCOUNTER — Encounter: Payer: Self-pay | Admitting: Family Medicine

## 2019-07-02 NOTE — Telephone Encounter (Signed)
NO ANSWER, MESSAGE LEFT FOR PATIENT. 

## 2019-07-02 NOTE — Telephone Encounter (Signed)
  Follow up Call-  Call back number 06/27/2019  Post procedure Call Back phone  # [phone]  Permission to leave phone message Yes  Some recent data might be hidden     Patient questions:  Do you have a fever, pain , or abdominal swelling? No. Pain Score  0 *  Have you tolerated food without any problems? Yes.    Have you been able to return to your normal activities? Yes.    Do you have any questions about your discharge instructions: Diet   No. Medications  No. Follow up visit  No.  Do you have questions or concerns about your Care? No.  Actions: * If pain score is 4 or above: No action needed, pain <4.  1. Have you developed a fever since your procedure? No  2.   Have you had an respiratory symptoms (SOB or cough) since your procedure? No  3.   Have you tested positive for COVID 19 since your procedure No  4.   Have you had any family members/close contacts diagnosed with the COVID 19 since your procedure?  No   If yes to any of these questions please route to Joylene John, RN and Alphonsa Gin, RN.

## 2019-07-24 ENCOUNTER — Other Ambulatory Visit: Payer: BC Managed Care – PPO

## 2019-08-07 ENCOUNTER — Other Ambulatory Visit: Payer: BC Managed Care – PPO

## 2020-02-03 ENCOUNTER — Encounter (HOSPITAL_COMMUNITY): Payer: Self-pay | Admitting: Emergency Medicine

## 2020-02-03 ENCOUNTER — Other Ambulatory Visit: Payer: Self-pay

## 2020-02-03 ENCOUNTER — Emergency Department (HOSPITAL_COMMUNITY)
Admission: EM | Admit: 2020-02-03 | Discharge: 2020-02-03 | Disposition: A | Discharge status: Home / Self Care | Payer: Self-pay | Attending: Emergency Medicine | Admitting: Emergency Medicine

## 2020-02-03 ENCOUNTER — Emergency Department (HOSPITAL_COMMUNITY): Payer: Self-pay

## 2020-02-03 DIAGNOSIS — Y9355 Activity, bike riding: Secondary | ICD-10-CM | POA: Insufficient documentation

## 2020-02-03 DIAGNOSIS — Y999 Unspecified external cause status: Secondary | ICD-10-CM | POA: Insufficient documentation

## 2020-02-03 DIAGNOSIS — S29011A Strain of muscle and tendon of front wall of thorax, initial encounter: Secondary | ICD-10-CM | POA: Insufficient documentation

## 2020-02-03 DIAGNOSIS — Y92828 Other wilderness area as the place of occurrence of the external cause: Secondary | ICD-10-CM | POA: Insufficient documentation

## 2020-02-03 MED ORDER — METHOCARBAMOL 500 MG PO TABS: 500.0000 mg | ORAL_TABLET | Freq: Two times a day (BID) | ORAL | 0 refills | Status: AC
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
## 2020-02-03 NOTE — ED Notes (Signed)
Pt returned from xray

## 2020-02-03 NOTE — Discharge Instructions (Addendum)
Your chest x-ray today was within normal limits.  I have prescribed a short course of muscle relaxers to help with your pain, please take 1 tablet twice a day for the next 7 days.  You may also apply heat or ice to the area.

## 2020-02-03 NOTE — ED Notes (Signed)
Patient verbalizes understanding of discharge instructions. Opportunity for questioning and answers were provided. Armband removed by staff, pt discharged from ED to home via POV  
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
## 2020-09-12 ENCOUNTER — Other Ambulatory Visit: Payer: Self-pay | Admitting: Urology

## 2020-09-12 ENCOUNTER — Other Ambulatory Visit: Payer: Self-pay

## 2020-09-12 DIAGNOSIS — Z125 Encounter for screening for malignant neoplasm of prostate: Secondary | ICD-10-CM

## 2020-09-12 NOTE — Addendum Note (Signed)
Addended by: Demetrius Revel on: 09/12/2020 02:13 PM   Modules accepted: Orders

## 2020-09-14 LAB — PSA: Prostate Specific Ag, Serum: 0.3 ng/mL (ref 0.0–4.0)

## 2021-06-29 ENCOUNTER — Other Ambulatory Visit: Payer: Self-pay | Admitting: Neurological Surgery

## 2021-07-23 NOTE — Progress Notes (Signed)
Surgical Instructions    Your procedure is scheduled on Friday, August 12th.  Report to [facility] Main Entrance "A" at 12:45 P.M., then check in with the Admitting office.  Call this number if you have problems the morning of surgery:  [phone]   If you have any questions prior to your surgery date call [phone]: Open Monday-Friday 8am-4pm    Remember:  Do not eat after midnight the night before your surgery  You may drink clear liquids until 11:45 AM the morning of your surgery.   Clear liquids allowed are: Water, Non-Citrus Juices (without pulp), Carbonated Beverages, Clear Tea, Black Coffee Only, and Gatorade    Take these medicines the morning of surgery with A SIP OF WATER   Claritin - if needed   As of today, STOP taking any Aspirin (unless otherwise instructed by your surgeon) Aleve, Naproxen, Ibuprofen, Motrin, Advil, Goody's, BC's, all herbal medications, fish oil, and all vitamins.          Do not wear jewelry or makeup Do not wear lotions, powders, colognes, or deodorant. Men may shave face and neck. Do not bring valuables to the hospital.             [facility] is not responsible for any belongings or valuables.  Do NOT Smoke (Tobacco/Vaping) or drink Alcohol 24 hours prior to your procedure If you use a CPAP at night, you may bring all equipment for your overnight stay.   Contacts, glasses, dentures or bridgework may not be worn into surgery, please bring cases for these belongings   For patients admitted to the hospital, discharge time will be determined by your treatment team.   Patients discharged the day of surgery will not be allowed to drive home, and someone needs to stay with them for 24 hours.  ONLY 1 SUPPORT PERSON MAY BE PRESENT WHILE YOU ARE IN SURGERY. IF YOU ARE TO BE ADMITTED ONCE YOU ARE IN YOUR ROOM YOU WILL BE ALLOWED TWO (2) VISITORS.  Minor children may have two parents present. Special consideration for safety and communication needs  will be reviewed on a case by case basis.  Special instructions:    Oral Hygiene is also important to reduce your risk of infection.  Remember - BRUSH YOUR TEETH THE MORNING OF SURGERY WITH YOUR REGULAR TOOTHPASTE   [facility]- Preparing For Surgery  Before surgery, you can play an important role. Because skin is not sterile, your skin needs to be as free of germs as possible. You can reduce the number of germs on your skin by washing with CHG (chlorahexidine gluconate) Soap before surgery.  CHG is an antiseptic cleaner which kills germs and bonds with the skin to continue killing germs even after washing.     Please do not use if you have an allergy to CHG or antibacterial soaps. If your skin becomes reddened/irritated stop using the CHG.  Do not shave (including legs and underarms) for at least 48 hours prior to first CHG shower. It is OK to shave your face.  Please follow these instructions carefully.     Shower the NIGHT BEFORE SURGERY and the MORNING OF SURGERY with CHG Soap.   If you chose to wash your hair, wash your hair first as usual with your normal shampoo. After you shampoo, rinse your hair and body thoroughly to remove the shampoo.  Then [employer] and genitals (private parts) with your normal soap and rinse thoroughly to remove soap.  After that Use  CHG Soap as you would any other liquid soap. You can apply CHG directly to the skin and wash gently with a scrungie or a clean washcloth.   Apply the CHG Soap to your body ONLY FROM THE NECK DOWN.  Do not use on open wounds or open sores. Avoid contact with your eyes, ears, mouth and genitals (private parts). Wash Face and genitals (private parts)  with your normal soap.   Wash thoroughly, paying special attention to the area where your surgery will be performed.  Thoroughly rinse your body with warm water from the neck down.  DO NOT shower/wash with your normal soap after using and rinsing off the CHG Soap.  Pat yourself dry  with a CLEAN TOWEL.  Wear CLEAN PAJAMAS to bed the night before surgery  Place CLEAN SHEETS on your bed the night before your surgery  DO NOT SLEEP WITH PETS.   Day of Surgery:  Take a shower with CHG soap. Wear Clean/Comfortable clothing the morning of surgery Do not apply any deodorants/lotions.   Remember to brush your teeth WITH YOUR REGULAR TOOTHPASTE.   Please read over the following fact sheets that you were given.

## 2021-07-26 ENCOUNTER — Encounter (HOSPITAL_COMMUNITY)
Admission: RE | Admit: 2021-07-26 | Discharge: 2021-07-26 | Disposition: A | Discharge status: Home / Self Care | Payer: 59 | Source: Ambulatory Visit | Attending: Neurological Surgery | Admitting: Neurological Surgery

## 2021-07-26 ENCOUNTER — Other Ambulatory Visit: Payer: Self-pay

## 2021-07-26 ENCOUNTER — Encounter (HOSPITAL_COMMUNITY): Payer: Self-pay

## 2021-07-26 DIAGNOSIS — Z01812 Encounter for preprocedural laboratory examination: Secondary | ICD-10-CM | POA: Insufficient documentation

## 2021-07-26 LAB — CBC
HCT: 43.7 % (ref 39.0–52.0)
Hemoglobin: 15 g/dL (ref 13.0–17.0)
MCH: 32.8 pg (ref 26.0–34.0)
MCHC: 34.3 g/dL (ref 30.0–36.0)
MCV: 95.4 fL (ref 80.0–100.0)
Platelets: 268 10*3/uL (ref 150–400)
RBC: 4.58 MIL/uL (ref 4.22–5.81)
RDW: 11.5 % (ref 11.5–15.5)
WBC: 5.8 10*3/uL (ref 4.0–10.5)
nRBC: 0 % (ref 0.0–0.2)

## 2021-07-26 LAB — TYPE AND SCREEN
ABO/RH(D): O POS
Antibody Screen: NEGATIVE

## 2021-07-26 LAB — SURGICAL PCR SCREEN
MRSA, PCR: NEGATIVE
Staphylococcus aureus: NEGATIVE

## 2021-07-26 NOTE — Progress Notes (Signed)
PCP: patient states does not have a PCP currently. Saw Luetta Nutting, DO 2 years ago.  Cardiologist: denies  EKG: na CXR: na ECHO: denies Stress Test: denies Cardiac Cath: denies  Fasting Blood Sugar- no Checks Blood Sugar__no_ times a day  OSA/CPAP: No  ASA/Blood Thinner: No  Covid test: Instructions/directions given for patient to be covid tested bewteen Aug 9-11.  Anesthesia Review: No  Patient denies shortness of breath, fever, cough, and chest pain at PAT appointment.  Patient verbalized understanding of instructions provided today at the PAT appointment.  Patient asked to review instructions at home and day of surgery.

## 2021-08-06 ENCOUNTER — Inpatient Hospital Stay (HOSPITAL_COMMUNITY): Admission: RE | Admit: 2021-08-06 | Payer: 59 | Source: Home / Self Care | Admitting: Neurological Surgery

## 2021-08-06 ENCOUNTER — Encounter (HOSPITAL_COMMUNITY): Admission: RE | Payer: Self-pay | Source: Home / Self Care

## 2021-08-06 SURGERY — TRANSFORAMINAL LUMBAR INTERBODY FUSION (TLIF) WITH PEDICLE SCREW FIXATION 1 LEVEL
Anesthesia: General | Site: Back

## 2021-08-15 ENCOUNTER — Other Ambulatory Visit: Payer: Self-pay

## 2021-08-15 ENCOUNTER — Emergency Department (HOSPITAL_COMMUNITY): Payer: 59

## 2021-08-15 ENCOUNTER — Encounter (HOSPITAL_COMMUNITY): Payer: Self-pay | Admitting: *Deleted

## 2021-08-15 ENCOUNTER — Emergency Department (HOSPITAL_COMMUNITY)
Admission: EM | Admit: 2021-08-15 | Discharge: 2021-08-16 | Disposition: A | Discharge status: Home / Self Care | Payer: 59 | Attending: Emergency Medicine | Admitting: Emergency Medicine

## 2021-08-15 DIAGNOSIS — S90411A Abrasion, right great toe, initial encounter: Secondary | ICD-10-CM | POA: Diagnosis not present

## 2021-08-15 DIAGNOSIS — Z23 Encounter for immunization: Secondary | ICD-10-CM | POA: Diagnosis not present

## 2021-08-15 DIAGNOSIS — S12200A Unspecified displaced fracture of third cervical vertebra, initial encounter for closed fracture: Secondary | ICD-10-CM | POA: Diagnosis not present

## 2021-08-15 DIAGNOSIS — R519 Headache, unspecified: Secondary | ICD-10-CM | POA: Insufficient documentation

## 2021-08-15 DIAGNOSIS — Y9355 Activity, bike riding: Secondary | ICD-10-CM | POA: Diagnosis not present

## 2021-08-15 DIAGNOSIS — S60512A Abrasion of left hand, initial encounter: Secondary | ICD-10-CM | POA: Insufficient documentation

## 2021-08-15 DIAGNOSIS — S0081XA Abrasion of other part of head, initial encounter: Secondary | ICD-10-CM | POA: Diagnosis not present

## 2021-08-15 DIAGNOSIS — Z20822 Contact with and (suspected) exposure to covid-19: Secondary | ICD-10-CM | POA: Insufficient documentation

## 2021-08-15 DIAGNOSIS — S60511A Abrasion of right hand, initial encounter: Secondary | ICD-10-CM | POA: Insufficient documentation

## 2021-08-15 DIAGNOSIS — Y92410 Unspecified street and highway as the place of occurrence of the external cause: Secondary | ICD-10-CM | POA: Diagnosis not present

## 2021-08-15 DIAGNOSIS — S199XXA Unspecified injury of neck, initial encounter: Secondary | ICD-10-CM | POA: Diagnosis present

## 2021-08-15 HISTORY — DX: Spinal stenosis, site unspecified: M48.00

## 2021-08-15 LAB — BASIC METABOLIC PANEL
Anion gap: 10 (ref 5–15)
BUN: 13 mg/dL (ref 6–20)
CO2: 27 mmol/L (ref 22–32)
Calcium: 9.7 mg/dL (ref 8.9–10.3)
Chloride: 99 mmol/L (ref 98–111)
Creatinine, Ser: 1.22 mg/dL (ref 0.61–1.24)
GFR, Estimated: >60 mL/min (ref >60)
Glucose, Bld: 107 mg/dL — ABNORMAL HIGH (ref 70–99)
Potassium: 3.8 mmol/L (ref 3.5–5.1)
Sodium: 136 mmol/L (ref 135–145)

## 2021-08-15 LAB — CBC WITH DIFFERENTIAL/PLATELET
Abs Immature Granulocytes: 0.02 10*3/uL (ref 0.00–0.07)
Basophils Absolute: 0 10*3/uL (ref 0.0–0.1)
Basophils Relative: 1 %
Eosinophils Absolute: 0 10*3/uL (ref 0.0–0.5)
Eosinophils Relative: 0 %
HCT: 45.1 % (ref 39.0–52.0)
Hemoglobin: 15.6 g/dL (ref 13.0–17.0)
Immature Granulocytes: 0 %
Lymphocytes Relative: 22 %
Lymphs Abs: 1.9 10*3/uL (ref 0.7–4.0)
MCH: 32.6 pg (ref 26.0–34.0)
MCHC: 34.6 g/dL (ref 30.0–36.0)
MCV: 94.4 fL (ref 80.0–100.0)
Monocytes Absolute: 0.4 10*3/uL (ref 0.1–1.0)
Monocytes Relative: 5 %
Neutro Abs: 6.1 10*3/uL (ref 1.7–7.7)
Neutrophils Relative %: 72 %
Platelets: 216 10*3/uL (ref 150–400)
RBC: 4.78 MIL/uL (ref 4.22–5.81)
RDW: 11.1 % — ABNORMAL LOW (ref 11.5–15.5)
WBC: 8.5 10*3/uL (ref 4.0–10.5)
nRBC: 0 % (ref 0.0–0.2)

## 2021-08-15 MED ORDER — TETANUS-DIPHTH-ACELL PERTUSSIS 5-2.5-18.5 LF-MCG/0.5 IM SUSY
0.5000 mL | PREFILLED_SYRINGE | Freq: Once | INTRAMUSCULAR | Status: AC
  Administered 2021-08-15: 0.5 mL via INTRAMUSCULAR
  Filled 2021-08-15: qty 0.5

## 2021-08-15 MED ORDER — OXYCODONE-ACETAMINOPHEN 5-325 MG PO TABS: 1.0000 | ORAL_TABLET | Freq: Four times a day (QID) | ORAL | 0 refills | Status: AC | PRN

## 2021-08-15 MED ORDER — OXYCODONE-ACETAMINOPHEN 5-325 MG PO TABS
1.0000 | ORAL_TABLET | Freq: Once | ORAL | Status: AC
  Administered 2021-08-15: 1 via ORAL
  Filled 2021-08-15: qty 1
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
## 2021-08-15 NOTE — ED Notes (Signed)
Patient transported to CT 

## 2021-08-15 NOTE — Discharge Instructions (Addendum)
You will need to wear the cervical collar at all times over the next few weeks.  You have a cervical fracture, this is going to be painful and you will need to take narcotic medicine most likely for pain.  You can also take Tylenol and Motrin as needed for breakthrough pain.  Do not drive or drink alcohol after taking this medication as it may make you drowsy and impair your judgment.  Please call [place] [facility] to set up a follow-up appointment.  Possibly should be seen at the end of this week or early next week in the office.  I recommend calling first thing in the morning to get on their schedule.
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
## 2021-08-15 NOTE — ED Notes (Signed)
Patient transported to X-ray 

## 2021-08-15 NOTE — ED Notes (Signed)
Patient transported to MRI 

## 2021-08-15 NOTE — ED Notes (Signed)
Patient returned from CT

## 2021-08-16 ENCOUNTER — Emergency Department (HOSPITAL_COMMUNITY): Payer: 59

## 2021-08-16 LAB — TYPE AND SCREEN
ABO/RH(D): O POS
Antibody Screen: NEGATIVE

## 2021-08-16 LAB — RESP PANEL BY RT-PCR (FLU A&B, COVID) ARPGX2
Influenza A by PCR: NEGATIVE
Influenza B by PCR: NEGATIVE
SARS Coronavirus 2 by RT PCR: NEGATIVE

## 2021-08-16 LAB — ETHANOL: Alcohol, Ethyl (B): <10 mg/dL (ref <10)

## 2021-08-16 NOTE — ED Provider Notes (Signed)
1:36 AM Imaging findings reviewed. MRI confirms C3 fx with evidence of associated ligamentous injury/strain, but no evidence of traumatic cord injury. Xray pelvis, hips, chest are normal without acute traumatic process.   2:15 AM Results reviewed with patient who verbalizes understanding.  He has been instructed to remain in his collar at all times.  Understands the need to follow-up with neurosurgery.  I have advised that he call tomorrow to ensure a follow-up appointment gets scheduled.  He has equal grip strength bilaterally with 5/5 strength against resistance in all major muscle groups bilaterally.  Moving extremities spontaneously, able to use a cell phone.  Stable for discharge with appropriate return precautions.  Patient with no unaddressed concerns.  Vitals:   08/15/21 2014 08/15/21 2145 08/15/21 2215 08/16/21 0145  BP:  (!) 142/80 (!) 151/83 (!) 142/73  Pulse:  66 64 69  Resp:   16 14  Temp:      TempSrc:      SpO2:  99% 98% 99%  Weight: 100.2 kg     Height: '6\' 1"'$  (1.854 m)       CLINICAL DATA: Initial evaluation for acute trauma.  EXAM: MRI CERVICAL SPINE WITHOUT CONTRAST  TECHNIQUE: Multiplanar, multisequence MR imaging of the cervical spine was performed. No intravenous contrast was administered.  COMPARISON: CT from 08/15/2021.  FINDINGS: Alignment: Reversal of the normal upper cervical lordosis, apex at C3-4. No interval listhesis or subluxation.  Vertebrae: Acute fracture extending through the anterior/inferior aspect of C3 again noted, stable. Additional fracture through the left superior articular process also unchanged. These are better evaluated on prior CT. No other visible fracture by MRI. Vertebral body height maintained. Bone marrow signal intensity within normal limits. No discrete or worrisome osseous lesions.  Cord: Signal intensity within the cervical spinal cord is normal. No evidence for traumatic cord injury. No epidural hematoma or  other collection.  Posterior Fossa, vertebral arteries, paraspinal tissues: Visualized brain and posterior fossa within normal limits. Craniocervical junction normal. There is abnormal edema involving the inter spinous ligaments of C3-4, concerning for acute ligamentous injury. Extension into the adjacent posterior paraspinous musculature, right slightly worse than left, consistent with associated muscular injury/strain. There is question of minimally increased focal signal abnormality involving the anterior longitudinal ligament at the level of the anterior C3 fracture, which could reflect acute ligamentous injury if not frank disruption (series 7, image 18). Posterior longitudinal ligament and ligamentum flavum appear grossly intact. Normal flow voids seen within the vertebral arteries bilaterally.  Disc levels:  C2-C3: Minimal annular disc bulge. No canal or foraminal stenosis.  C3-C4: Slight asymmetric widening of the C3-4 interspace posteriorly. Associated mild disc bulge with uncovertebral hypertrophy, greater on the right. Posterior disc osteophyte flattens and partially faces the ventral thecal sac, greater on the right. Minimal cord flattening without cord signal changes. Foramina remain patent.  C4-C5: Mild disc bulge with uncovertebral hypertrophy. Posterior disc osteophyte mildly indents the ventral thecal sac. Minimal cord flattening without cord signal changes. Mild right greater than left C5 foraminal stenosis.  C5-C6: Mild disc bulge with uncovertebral hypertrophy. Flattening of the ventral thecal sac without significant spinal stenosis. Moderate right with mild left C6 foraminal narrowing.  C6-C7: Minimal annular disc bulge with uncovertebral spurring, greater on the left. No spinal stenosis. Mild left C7 foraminal stenosis. Right neural foramina remains patent.  C7-T1: Negative interspace. Mild facet hypertrophy. No significant canal or foraminal  stenosis.  Visualized upper thoracic spine demonstrates no acute finding.  IMPRESSION: 1. Acute fracture  extending through the anterior/inferior aspect of C3 with additional nondisplaced fracture through the left superior articular process of C3, stable, and better evaluated on prior CT. 2. Abnormal edema within the inter spinous ligaments of C3-4, consistent with acute ligamentous injury/strain. Associated muscular injury/strain within the adjacent right greater than left posterior paraspinous musculature. 3. Question minimally increased signal abnormality involving the anterior longitudinal ligament at the level of the anterior C3 fracture, also suspicious for possible ligamentous injury if not frank disruption. 4. No other acute traumatic injury within the cervical spine. No evidence for traumatic cord injury. 5. 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  Electronically Signed By: Jeannine Boga M.D. On: 08/16/2021 01:19   Antonietta Breach, PA-C 08/16/21 0216    Maudie Flakes, MD 08/16/21 [phone]

## 2021-08-16 NOTE — ED Notes (Signed)
Pt states to this RN that percocet/roxicet tablet from 2320 has helped with his pain.

## 2021-08-16 NOTE — ED Notes (Signed)
RN cleaned the skin abrasions of pt's scalp with sterile water, applied bacitracin gel, and covered abrasions with gauze.

## 2021-08-16 NOTE — ED Notes (Signed)
E-signature pad unavailable at time of pt discharge. This RN discussed discharge materials with pt and answered all pt questions. Pt stated understanding of discharge material. This RN heavily emphasized the importance and need for pt to continue wearing his cervical collar continuously over the next weeks as his injury heals, as included in his d/c paperwork.

## 2023-04-10 ENCOUNTER — Encounter: Payer: Self-pay | Admitting: *Deleted
